# Patient Record
Sex: Male | Born: 1995 | Race: Black or African American | Hispanic: No | Marital: Single | Smoking: Never smoker
Health system: Southern US, Community
[De-identification: ages and names within clinical notes are randomized; demographics above are authoritative.]

---

## 2015-07-30 ENCOUNTER — Emergency Department (HOSPITAL_COMMUNITY): Payer: Medicaid Other

## 2015-07-30 ENCOUNTER — Emergency Department (HOSPITAL_COMMUNITY)
Admission: EM | Admit: 2015-07-30 | Discharge: 2015-07-31 | Disposition: A | Discharge status: Home / Self Care | Payer: Medicaid Other | Attending: Emergency Medicine | Admitting: Emergency Medicine

## 2015-07-30 ENCOUNTER — Encounter (HOSPITAL_COMMUNITY): Payer: Self-pay | Admitting: *Deleted

## 2015-07-30 DIAGNOSIS — R0789 Other chest pain: Secondary | ICD-10-CM | POA: Insufficient documentation

## 2015-07-30 DIAGNOSIS — R079 Chest pain, unspecified: Secondary | ICD-10-CM | POA: Diagnosis present

## 2015-07-30 DIAGNOSIS — R42 Dizziness and giddiness: Secondary | ICD-10-CM | POA: Insufficient documentation

## 2015-07-30 LAB — BASIC METABOLIC PANEL
Anion gap: 7 (ref 5–15)
BUN: 8 mg/dL (ref 6–20)
CHLORIDE: 100 mmol/L — AB (ref 101–111)
CO2: 31 mmol/L (ref 22–32)
CREATININE: 1.04 mg/dL (ref 0.61–1.24)
Calcium: 9.6 mg/dL (ref 8.9–10.3)
GFR calc Af Amer: >60 mL/min (ref >60)
GLUCOSE: 88 mg/dL (ref 65–99)
POTASSIUM: 3.9 mmol/L (ref 3.5–5.1)
SODIUM: 138 mmol/L (ref 135–145)

## 2015-07-30 LAB — CBC
HEMATOCRIT: 45.7 % (ref 39.0–52.0)
Hemoglobin: 16.2 g/dL (ref 13.0–17.0)
MCH: 31.8 pg (ref 26.0–34.0)
MCHC: 35.4 g/dL (ref 30.0–36.0)
MCV: 89.6 fL (ref 78.0–100.0)
PLATELETS: 340 10*3/uL (ref 150–400)
RBC: 5.1 MIL/uL (ref 4.22–5.81)
RDW: 12.7 % (ref 11.5–15.5)
WBC: 6.3 10*3/uL (ref 4.0–10.5)

## 2015-07-30 LAB — I-STAT TROPONIN, ED: Troponin i, poc: 0.01 ng/mL (ref 0.00–0.08)

## 2015-07-30 NOTE — ED Notes (Signed)
EKG given to EDP,J.Knapp,MD., for review. 

## 2015-07-30 NOTE — ED Notes (Signed)
Pt states that he began to have mid chest pain on Sunday; pt states that it has been intermittent since Sunday; pt states that he can be lightheaded at times with the chest pain; pt states that nothing seems to make the pain or worse; pt denies N/V or any other sx at present

## 2015-07-30 NOTE — ED Provider Notes (Signed)
CSN: [id number]     Arrival date & time 07/30/15  2119 History  By signing my name below, I, Budd Palmer, attest that this documentation has been prepared under the direction and in the presence of Devoria Albe, MD at 2333. Electronically Signed: Budd Palmer, ED Scribe. 07/30/2015. 11:43 PM.    Chief Complaint  Patient presents with  . Chest Pain   The history is provided by the patient. No language interpreter was used.   HPI Comments: Jacob Dunn is a 19 y.o. male who presents to the Emergency Department complaining of intermittent, pressure-like, central upper chest pain onset 5 days ago. He states he was sitting in his room at rest when the pain first began. He notes each episode lasts for a few minutes. He has a couple of episodes a week.  He notes his last episode occurred 9 hours ago. He reports he has felt fine since that time. He reports associated lightheadedness. He notes he has had similar pains before, noting that he has had them since this summer. He notes no exacerbating or alleviating factors. He states he has not tried taking any medication for this. He states he is not on any medications. He denies a PMHx of HTN. He notes a FHx of heart problems (father's mother has pacemaker), and MI (paternal grandfather). He notes his grandfather had an MI this summer. He denies smoking, but states he drinks on weekends (3-4 cups of liquor). 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 Pt denies nausea, vomiting, SOB, diaphoresis, and leg swelling or pain. He denies being under extra stress.   PCP none  History reviewed. No pertinent past medical history. History reviewed. No pertinent past surgical history. No family history on file. Social History  Substance Use Topics  . Smoking status: Never Smoker   . Smokeless tobacco: None  . Alcohol Use: Yes     Comment: socially  employed  Review of Systems   Constitutional: Negative for diaphoresis.  Respiratory: Negative for shortness of breath.   Cardiovascular: Positive for chest pain. Negative for leg swelling.  Gastrointestinal: Negative for nausea.  Neurological: Positive for light-headedness.  All other systems reviewed and are negative.   Allergies  Review of patient's allergies indicates no known allergies.  Home Medications   Prior to Admission medications   Not on File   BP 113/70 mmHg  Pulse 61  Temp(Src) 98.3 F (36.8 C) (Oral)  Resp 21  SpO2 100%  Vital signs normal   Physical Exam  Constitutional: He is oriented to person, place, and time. He appears well-developed and well-nourished.  Non-toxic appearance. He does not appear ill. No distress.  HENT:  Head: Normocephalic and atraumatic.  Right Ear: External ear normal.  Left Ear: External ear normal.  Nose: Nose normal. No mucosal edema or rhinorrhea.  Mouth/Throat: Oropharynx is clear and moist and mucous membranes are normal. No dental abscesses or uvula swelling.  Eyes: Conjunctivae and EOM are normal. Pupils are equal, round, and reactive to light.  Neck: Normal range of motion and full passive range of motion without pain. Neck supple.  Cardiovascular: Normal rate, regular rhythm and normal heart sounds.  Exam reveals no gallop and no friction rub.   No murmur heard. Pulmonary/Chest: Effort normal and breath sounds normal. No respiratory distress. He has no wheezes. He has no rhonchi. He has no rales. He exhibits no tenderness and  no crepitus.    Area of chest pain noted.   Abdominal: Soft. Normal appearance and bowel sounds are normal. He exhibits no distension. There is no tenderness. There is no rebound and no guarding.  Musculoskeletal: Normal range of motion. He exhibits no edema or tenderness.  Moves all extremities well.   Neurological: He is alert and oriented to person, place, and time. He has normal strength. No cranial nerve deficit.  Skin:  Skin is warm, dry and intact. No rash noted. No erythema. No pallor.  Psychiatric: He has a normal mood and affect. His speech is normal and behavior is normal. His mood appears not anxious.  Nursing note and vitals reviewed.   ED Course  Procedures  DIAGNOSTIC STUDIES: Oxygen Saturation is 100% on RA, normal by my interpretation.    COORDINATION OF CARE: 11:43 PM - Discussed normal EKG, chest XR, and lab results. Discussed plans to wait on remaining diagnostic studies still pending. Advised to return to the ED if symptoms worsen. Pt advised of plan for treatment and pt agrees.  Labs Review Results for orders placed or performed during the hospital encounter of 07/30/15  Basic metabolic panel  Result Value Ref Range   Sodium 138 135 - 145 mmol/L   Potassium 3.9 3.5 - 5.1 mmol/L   Chloride 100 (L) 101 - 111 mmol/L   CO2 31 22 - 32 mmol/L   Glucose, Bld 88 65 - 99 mg/dL   BUN 8 6 - 20 mg/dL   Creatinine, Ser 1.47 0.61 - 1.24 mg/dL   Calcium 9.6 8.9 - 82.9 mg/dL   GFR calc non Af Amer >60 >60 mL/min   GFR calc Af Amer >60 >60 mL/min   Anion gap 7 5 - 15  CBC  Result Value Ref Range   WBC 6.3 4.0 - 10.5 K/uL   RBC 5.10 4.22 - 5.81 MIL/uL   Hemoglobin 16.2 13.0 - 17.0 g/dL   HCT 56.2 13.0 - 86.5 %   MCV 89.6 78.0 - 100.0 fL   MCH 31.8 26.0 - 34.0 pg   MCHC 35.4 30.0 - 36.0 g/dL   RDW 78.4 69.6 - 29.5 %   Platelets 340 150 - 400 K/uL  I-stat troponin, ED  Result Value Ref Range   Troponin i, poc 0.01 0.00 - 0.08 ng/mL   Comment 3           Laboratory interpretation all normal   Imaging Review Dg Chest 2 View  07/30/2015  CLINICAL DATA:  Intermittent diffuse chest pain for 4 days, lightheadedness. EXAM: CHEST  2 VIEW COMPARISON:  None. FINDINGS: Cardiomediastinal silhouette is normal. The lungs are clear without pleural effusions or focal consolidations. Trachea projects midline and there is no pneumothorax. Soft tissue planes and included osseous structures are  non-suspicious. IMPRESSION: Normal chest. Electronically Signed   By: Awilda Metro M.D.   On: 07/30/2015 22:27   I have personally reviewed and evaluated these images and lab results as part of my medical decision-making.   EKG Interpretation   Date/Time:  Wednesday July 30 2015 21:41:30 EDT Ventricular Rate:  61 PR Interval:  111 QRS Duration: 86 QT Interval:  388 QTC Calculation: 391 R Axis:   92 Text Interpretation:  Sinus rhythm Borderline short PR interval Borderline  right axis deviation Baseline wander in lead(s) I III aVR aVL No old  tracing to compare Confirmed by Kinga Cassar  MD-J, JON ([id number]) on 07/30/2015  9:59:47 PM      MDM  Final diagnoses:  Atypical chest pain   Plan discharge  Devoria AlbeIva Janelie Goltz, MD, FACEP  I personally performed the services described in this documentation, which was scribed in my presence. The recorded information has been reviewed and considered.  Devoria AlbeIva Jamar Weatherall, MD, Concha PyoFACEP   Eddye Broxterman, MD 07/31/15 [phone]

## 2015-07-31 NOTE — ED Notes (Signed)
Patient was alert, oriented and stable upon discharge. RN went over AVS and patient had no further questions.  

## 2015-07-31 NOTE — Discharge Instructions (Signed)
You can take ibuprofen 600 mg 4 times a day for pain if needed. Your tests tonight are normal. Your blood pressure is normal. Drink a lot of fluids especially when it is warm outside. Return to the ED if you get chest pain that is constant and lasts more than 20-30 minutes, you get sweaty, feel short of breath or you feel worse.     Nonspecific Chest Pain It is often hard to find the cause of chest pain. There is always a chance that your pain could be related to something serious, such as a heart attack or a blood clot in your lungs. Chest pain can also be caused by conditions that are not life-threatening. If you have chest pain, it is very important to follow up with your doctor.  HOME CARE  If you were prescribed an antibiotic medicine, finish it all even if you start to feel better.  Avoid any activities that cause chest pain.  Do not use any tobacco products, including cigarettes, chewing tobacco, or electronic cigarettes. If you need help quitting, ask your doctor.  Do not drink alcohol.  Take medicines only as told by your doctor.  Keep all follow-up visits as told by your doctor. This is important. This includes any further testing if your chest pain does not go away.  Your doctor may tell you to keep your head raised (elevated) while you sleep.  Make lifestyle changes as told by your doctor. These may include:  Getting regular exercise. Ask your doctor to suggest some activities that are safe for you.  Eating a heart-healthy diet. Your doctor or a diet specialist (dietitian) can help you to learn healthy eating options.  Maintaining a healthy weight.  Managing diabetes, if necessary.  Reducing stress. GET HELP IF:  Your chest pain does not go away, even after treatment.  You have a rash with blisters on your chest.  You have a fever. GET HELP RIGHT AWAY IF:  Your chest pain is worse.  You have an increasing cough, or you cough up blood.  You have severe belly  (abdominal) pain.  You feel extremely weak.  You pass out (faint).  You have chills.  You have sudden, unexplained chest discomfort.  You have sudden, unexplained discomfort in your arms, back, neck, or jaw.  You have shortness of breath at any time.  You suddenly start to sweat, or your skin gets clammy.  You feel nauseous.  You vomit.  You suddenly feel light-headed or dizzy.  Your heart begins to beat quickly, or it feels like it is skipping beats. These symptoms may be an emergency. Do not wait to see if the symptoms will go away. Get medical help right away. Call your local emergency services (911 in the [place] Do not drive yourself to the hospital.   This information is not intended to replace advice given to you by your health care provider. Make sure you discuss any questions you have with your health care provider.   Document Released: 03/15/2008 Document Revised: 10/18/2014 Document Reviewed: 05/03/2014 Elsevier Interactive Patient Education [employer]2016 Elsevier Inc.

## 2015-09-19 ENCOUNTER — Emergency Department (HOSPITAL_COMMUNITY)
Admission: EM | Admit: 2015-09-19 | Discharge: 2015-09-19 | Disposition: A | Discharge status: Home / Self Care | Payer: Medicaid Other | Attending: Emergency Medicine | Admitting: Emergency Medicine

## 2015-09-19 ENCOUNTER — Encounter (HOSPITAL_COMMUNITY): Payer: Self-pay | Admitting: *Deleted

## 2015-09-19 DIAGNOSIS — R1013 Epigastric pain: Secondary | ICD-10-CM | POA: Diagnosis not present

## 2015-09-19 DIAGNOSIS — R109 Unspecified abdominal pain: Secondary | ICD-10-CM | POA: Diagnosis present

## 2015-09-19 LAB — COMPREHENSIVE METABOLIC PANEL
ALK PHOS: 64 U/L (ref 38–126)
ALT: 22 U/L (ref 17–63)
AST: 25 U/L (ref 15–41)
Albumin: 4.6 g/dL (ref 3.5–5.0)
Anion gap: 8 (ref 5–15)
BILIRUBIN TOTAL: 0.9 mg/dL (ref 0.3–1.2)
BUN: 8 mg/dL (ref 6–20)
CALCIUM: 9.5 mg/dL (ref 8.9–10.3)
CO2: 31 mmol/L (ref 22–32)
CREATININE: 0.99 mg/dL (ref 0.61–1.24)
Chloride: 99 mmol/L — ABNORMAL LOW (ref 101–111)
Glucose, Bld: 108 mg/dL — ABNORMAL HIGH (ref 65–99)
Potassium: 3.7 mmol/L (ref 3.5–5.1)
Sodium: 138 mmol/L (ref 135–145)
TOTAL PROTEIN: 7.5 g/dL (ref 6.5–8.1)

## 2015-09-19 LAB — CBC WITH DIFFERENTIAL/PLATELET
BASOS PCT: 0 %
Basophils Absolute: 0 10*3/uL (ref 0.0–0.1)
EOS ABS: 0.3 10*3/uL (ref 0.0–0.7)
EOS PCT: 5 %
HCT: 43.3 % (ref 39.0–52.0)
Hemoglobin: 15 g/dL (ref 13.0–17.0)
LYMPHS ABS: 2.2 10*3/uL (ref 0.7–4.0)
Lymphocytes Relative: 45 %
MCH: 30.9 pg (ref 26.0–34.0)
MCHC: 34.6 g/dL (ref 30.0–36.0)
MCV: 89.3 fL (ref 78.0–100.0)
Monocytes Absolute: 0.5 10*3/uL (ref 0.1–1.0)
Monocytes Relative: 11 %
Neutro Abs: 2 10*3/uL (ref 1.7–7.7)
Neutrophils Relative %: 39 %
PLATELETS: 256 10*3/uL (ref 150–400)
RBC: 4.85 MIL/uL (ref 4.22–5.81)
RDW: 12.7 % (ref 11.5–15.5)
WBC: 5 10*3/uL (ref 4.0–10.5)

## 2015-09-19 LAB — LIPASE, BLOOD: LIPASE: 30 U/L (ref 11–51)

## 2015-09-19 MED ORDER — PANTOPRAZOLE SODIUM 40 MG PO TBEC
40.0000 mg | DELAYED_RELEASE_TABLET | Freq: Once | ORAL | Status: AC
  Administered 2015-09-19: 40 mg via ORAL
  Filled 2015-09-19: qty 1

## 2015-09-19 MED ORDER — SODIUM CHLORIDE 0.9 % IV SOLN: 1000.0000 mL | INTRAVENOUS | Status: DC

## 2015-09-19 MED ORDER — SODIUM CHLORIDE 0.9 % IV SOLN
1000.0000 mL | Freq: Once | INTRAVENOUS | Status: AC
  Administered 2015-09-19: 1000 mL via INTRAVENOUS

## 2015-09-19 MED ORDER — GI COCKTAIL ~~LOC~~
30.0000 mL | Freq: Once | ORAL | Status: DC
  Filled 2015-09-19: qty 30

## 2015-09-19 MED ORDER — ONDANSETRON HCL 4 MG/2ML IJ SOLN
4.0000 mg | Freq: Once | INTRAMUSCULAR | Status: AC
  Administered 2015-09-19: 4 mg via INTRAVENOUS
  Filled 2015-09-19: qty 2

## 2015-09-19 MED ORDER — DIPHENHYDRAMINE HCL 50 MG/ML IJ SOLN
25.0000 mg | Freq: Once | INTRAMUSCULAR | Status: AC
  Administered 2015-09-19: 25 mg via INTRAVENOUS
  Filled 2015-09-19: qty 1

## 2015-09-19 MED ORDER — PANTOPRAZOLE SODIUM 40 MG PO TBEC: 40.0000 mg | DELAYED_RELEASE_TABLET | Freq: Every day | ORAL | Status: DC

## 2015-09-19 NOTE — ED Provider Notes (Signed)
CSN: [id number]     Arrival date & time 09/19/15  0516 History   First MD Initiated Contact with Patient 09/19/15 0540     Chief Complaint  Patient presents with  . Abdominal Pain     (Consider location/radiation/quality/duration/timing/severity/associated sxs/prior Treatment) Patient is a 19 y.o. male presenting with abdominal pain. The history is provided by the patient.  Abdominal Pain He states that about one hour ago, his abdomen started "feeling funny". Is also noted some shaking in his right leg. He denies any actual pain. There has been nausea but no vomiting. He denies constipation or diarrhea. He denies fever or chills. Nothing seems to make symptoms better nothing makes them worse. He does drink alcohol occasionally but states he had not been drinking last night. He had not eaten anything out of the ordinary.  History reviewed. No pertinent past medical history. History reviewed. No pertinent past surgical history. No family history on file. Social History  Substance Use Topics  . Smoking status: Never Smoker   . Smokeless tobacco: None  . Alcohol Use: Yes     Comment: socially    Review of Systems  Gastrointestinal: Positive for abdominal pain.  All other systems reviewed and are negative.     Allergies  Review of patient's allergies indicates no known allergies.  Home Medications   Prior to Admission medications   Not on File   BP 152/92 mmHg  Pulse 86  Temp(Src) 98.4 F (36.9 C) (Oral)  Resp 18  SpO2 100% Physical Exam  Nursing note and vitals reviewed.  19 year old male, resting comfortably and in no acute distress. Vital signs are significant for hypertension. Oxygen saturation is 100%, which is normal. Head is normocephalic and atraumatic. PERRLA, EOMI. Oropharynx is clear. Neck is nontender and supple without adenopathy or JVD. Back is nontender and there is no CVA tenderness. Lungs are clear without rales, wheezes, or rhonchi. Chest is  nontender. Heart has regular rate and rhythm without murmur. Abdomen is soft, flat, with mild epigastric tenderness. There are no masses or hepatosplenomegaly and peristalsis is normoactive. Extremities have no cyanosis or edema, full range of motion is present. Skin is warm and dry without rash. Neurologic: Mental status is normal, cranial nerves are intact, there are no motor or sensory deficits.  ED Course  Procedures (including critical care time) Labs Review Results for orders placed or performed during the hospital encounter of 09/19/15  Comprehensive metabolic panel  Result Value Ref Range   Sodium 138 135 - 145 mmol/L   Potassium 3.7 3.5 - 5.1 mmol/L   Chloride 99 (L) 101 - 111 mmol/L   CO2 31 22 - 32 mmol/L   Glucose, Bld 108 (H) 65 - 99 mg/dL   BUN 8 6 - 20 mg/dL   Creatinine, Ser 4.090.99 0.61 - 1.24 mg/dL   Calcium 9.5 8.9 - 81.110.3 mg/dL   Total Protein 7.5 6.5 - 8.1 g/dL   Albumin 4.6 3.5 - 5.0 g/dL   AST 25 15 - 41 U/L   ALT 22 17 - 63 U/L   Alkaline Phosphatase 64 38 - 126 U/L   Total Bilirubin 0.9 0.3 - 1.2 mg/dL   GFR calc non Af Amer >60 >60 mL/min   GFR calc Af Amer >60 >60 mL/min   Anion gap 8 5 - 15  Lipase, blood  Result Value Ref Range   Lipase 30 11 - 51 U/L  CBC with Differential  Result Value Ref Range   WBC  5.0 4.0 - 10.5 K/uL   RBC 4.85 4.22 - 5.81 MIL/uL   Hemoglobin 15.0 13.0 - 17.0 g/dL   HCT 45.4 09.8 - 11.9 %   MCV 89.3 78.0 - 100.0 fL   MCH 30.9 26.0 - 34.0 pg   MCHC 34.6 30.0 - 36.0 g/dL   RDW 14.7 82.9 - 56.2 %   Platelets 256 150 - 400 K/uL   Neutrophils Relative % 39 %   Neutro Abs 2.0 1.7 - 7.7 K/uL   Lymphocytes Relative 45 %   Lymphs Abs 2.2 0.7 - 4.0 K/uL   Monocytes Relative 11 %   Monocytes Absolute 0.5 0.1 - 1.0 K/uL   Eosinophils Relative 5 %   Eosinophils Absolute 0.3 0.0 - 0.7 K/uL   Basophils Relative 0 %   Basophils Absolute 0.0 0.0 - 0.1 K/uL   I have personally reviewed and evaluated these lab results as part of my  medical decision-making.   MDM   Final diagnoses:  Epigastric pain    Epigastric tenderness which may represent gastritis. He will be given therapeutic trial of a GI cocktail and screening labs obtained. He will also be given a dose of diphenhydramine to see if it helps with his leg shaking. No indication of serious pathology on exam.  Laboratory workup is unremarkable. He feels much better after above noted treatment. He is discharged with prescription for pantoprazole.  Dione Booze, MD 09/19/15 [phone]

## 2015-09-19 NOTE — Discharge Instructions (Signed)
Abdominal Pain, Adult Many things can cause abdominal pain. Usually, abdominal pain is not caused by a disease and will improve without treatment. It can often be observed and treated at home. Your health care provider will do a physical exam and possibly order blood tests and X-rays to help determine the seriousness of your pain. However, in many cases, more time must pass before a clear cause of the pain can be found. Before that point, your health care provider may not know if you need more testing or further treatment. HOME CARE INSTRUCTIONS Monitor your abdominal pain for any changes. The following actions may help to alleviate any discomfort you are experiencing:  Only take over-the-counter or prescription medicines as directed by your health care provider.  Do not take laxatives unless directed to do so by your health care provider.  Try a clear liquid diet (broth, tea, or water) as directed by your health care provider. Slowly move to a bland diet as tolerated. SEEK MEDICAL CARE IF:  You have unexplained abdominal pain.  You have abdominal pain associated with nausea or diarrhea.  You have pain when you urinate or have a bowel movement.  You experience abdominal pain that wakes you in the night.  You have abdominal pain that is worsened or improved by eating food.  You have abdominal pain that is worsened with eating fatty foods.  You have a fever. SEEK IMMEDIATE MEDICAL CARE IF:  Your pain does not go away within 2 hours.  You keep throwing up (vomiting).  Your pain is felt only in portions of the abdomen, such as the right side or the left lower portion of the abdomen.  You pass bloody or black tarry stools. MAKE SURE YOU:  Understand these instructions.  Will watch your condition.  Will get help right away if you are not doing well or get worse.   This information is not intended to replace advice given to you by your health care provider. Make sure you discuss  any questions you have with your health care provider.   Document Released: 07/07/2005 Document Revised: 06/18/2015 Document Reviewed: 06/06/2013 Elsevier Interactive Patient Education 2016 Elsevier Inc.  Pantoprazole tablets What is this medicine? PANTOPRAZOLE (pan TOE pra zole) prevents the production of acid in the stomach. It is used to treat gastroesophageal reflux disease (GERD), inflammation of the esophagus, and Zollinger-Ellison syndrome. This medicine may be used for other purposes; ask your health care provider or pharmacist if you have questions. What should I tell my health care provider before I take this medicine? They need to know if you have any of these conditions: -liver disease -low levels of magnesium in the blood -an unusual or allergic reaction to omeprazole, lansoprazole, pantoprazole, rabeprazole, other medicines, foods, dyes, or preservatives -pregnant or trying to get pregnant -breast-feeding How should I use this medicine? Take this medicine by mouth. Swallow the tablets whole with a drink of water. Follow the directions on the prescription label. Do not crush, break, or chew. Take your medicine at regular intervals. Do not take your medicine more often than directed. Talk to your pediatrician regarding the use of this medicine in children. While this drug may be prescribed for children as young as 5 years for selected conditions, precautions do apply. Overdosage: If you think you have taken too much of this medicine contact a poison control center or emergency room at once. NOTE: This medicine is only for you. Do not share this medicine with others. What if I  miss a dose? If you miss a dose, take it as soon as you can. If it is almost time for your next dose, take only that dose. Do not take double or extra doses. What may interact with this medicine? Do not take this medicine with any of the following medications: -atazanavir -nelfinavir This medicine may  also interact with the following medications: -ampicillin -delavirdine -erlotinib -iron salts -medicines for fungal infections like ketoconazole, itraconazole and voriconazole -methotrexate -mycophenolate mofetil -warfarin This list may not describe all possible interactions. Give your health care provider a list of all the medicines, herbs, non-prescription drugs, or dietary supplements you use. Also tell them if you smoke, drink alcohol, or use illegal drugs. Some items may interact with your medicine. What should I watch for while using this medicine? It can take several days before your stomach pain gets better. Check with your doctor or health care professional if your condition does not start to get better, or if it gets worse. You may need blood work done while you are taking this medicine. What side effects may I notice from receiving this medicine? Side effects that you should report to your doctor or health care professional as soon as possible: -allergic reactions like skin rash, itching or hives, swelling of the face, lips, or tongue -bone, muscle or joint pain -breathing problems -chest pain or chest tightness -dark yellow or brown urine -dizziness -fast, irregular heartbeat -feeling faint or lightheaded -fever or sore throat -muscle spasm -palpitations -redness, blistering, peeling or loosening of the skin, including inside the mouth -seizures -tremors -unusual bleeding or bruising -unusually weak or tired -yellowing of the eyes or skin Side effects that usually do not require medical attention (Report these to your doctor or health care professional if they continue or are bothersome.): -constipation -diarrhea -dry mouth -headache -nausea This list may not describe all possible side effects. Call your doctor for medical advice about side effects. You may report side effects to FDA at 1-800-FDA-1088. Where should I keep my medicine? Keep out of the reach of  children. Store at room temperature between 15 and 30 degrees C (59 and 86 degrees F). Protect from light and moisture. Throw away any unused medicine after the expiration date. NOTE: This sheet is a summary. It may not cover all possible information. If you have questions about this medicine, talk to your doctor, pharmacist, or health care provider.    2016, Elsevier/[employer]. (2014-11-15 14:45:56)

## 2015-09-19 NOTE — ED Notes (Signed)
Pt states that he began to have abd pain approx an hour ago; pt c/o mid abd pain; pt denies N/V/D; pt states that he is also having "leg shaking"; pt states that his legs shake and then stop and then shake again; no shaking noted in triage

## 2015-11-25 ENCOUNTER — Emergency Department (HOSPITAL_COMMUNITY): Payer: Medicaid Other

## 2015-11-25 ENCOUNTER — Encounter (HOSPITAL_COMMUNITY): Payer: Self-pay | Admitting: Emergency Medicine

## 2015-11-25 ENCOUNTER — Emergency Department (HOSPITAL_COMMUNITY)
Admission: EM | Admit: 2015-11-25 | Discharge: 2015-11-25 | Disposition: A | Discharge status: Home / Self Care | Payer: Medicaid Other | Attending: Emergency Medicine | Admitting: Emergency Medicine

## 2015-11-25 DIAGNOSIS — Y9389 Activity, other specified: Secondary | ICD-10-CM | POA: Diagnosis not present

## 2015-11-25 DIAGNOSIS — S3992XA Unspecified injury of lower back, initial encounter: Secondary | ICD-10-CM | POA: Diagnosis not present

## 2015-11-25 DIAGNOSIS — S20212A Contusion of left front wall of thorax, initial encounter: Secondary | ICD-10-CM | POA: Insufficient documentation

## 2015-11-25 DIAGNOSIS — Y92410 Unspecified street and highway as the place of occurrence of the external cause: Secondary | ICD-10-CM | POA: Insufficient documentation

## 2015-11-25 DIAGNOSIS — S29001A Unspecified injury of muscle and tendon of front wall of thorax, initial encounter: Secondary | ICD-10-CM | POA: Diagnosis present

## 2015-11-25 DIAGNOSIS — Y998 Other external cause status: Secondary | ICD-10-CM | POA: Insufficient documentation

## 2015-11-25 DIAGNOSIS — Z79899 Other long term (current) drug therapy: Secondary | ICD-10-CM | POA: Insufficient documentation

## 2015-11-25 MED ORDER — NAPROXEN 500 MG PO TABS
500.0000 mg | ORAL_TABLET | Freq: Once | ORAL | Status: AC
  Administered 2015-11-25: 500 mg via ORAL
  Filled 2015-11-25: qty 1

## 2015-11-25 MED ORDER — NAPROXEN 500 MG PO TABS: 500.0000 mg | ORAL_TABLET | Freq: Two times a day (BID) | ORAL | Status: DC
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
## 2015-11-28 ENCOUNTER — Emergency Department (HOSPITAL_COMMUNITY)
Admission: EM | Admit: 2015-11-28 | Discharge: 2015-11-28 | Disposition: A | Discharge status: Home / Self Care | Payer: Medicaid Other | Attending: Emergency Medicine | Admitting: Emergency Medicine

## 2015-11-28 ENCOUNTER — Encounter (HOSPITAL_COMMUNITY): Payer: Self-pay | Admitting: Emergency Medicine

## 2015-11-28 DIAGNOSIS — R0781 Pleurodynia: Secondary | ICD-10-CM

## 2015-11-28 DIAGNOSIS — Z791 Long term (current) use of non-steroidal anti-inflammatories (NSAID): Secondary | ICD-10-CM | POA: Diagnosis not present

## 2015-11-28 DIAGNOSIS — Z09 Encounter for follow-up examination after completed treatment for conditions other than malignant neoplasm: Secondary | ICD-10-CM

## 2015-11-28 DIAGNOSIS — Z79899 Other long term (current) drug therapy: Secondary | ICD-10-CM | POA: Diagnosis not present

## 2015-11-28 NOTE — ED Notes (Signed)
Per pt, states he was told to come back in for follow up regarding left rib injury

## 2015-11-28 NOTE — Discharge Instructions (Signed)
You have been seen today for a follow-up on rib pain. Your injuries seem to be healing well. No abnormalities were found on your exam. Follow up with PCP as needed. Return to ED should symptoms worsen. Continue to use ice and ibuprofen or naproxen as prescribed.   Emergency Department Resource Guide 1) Find a Doctor and Pay Out of Pocket Although you won't have to find out who is covered by your insurance plan, it is a good idea to ask around and get recommendations. You will then need to call the office and see if the doctor you have chosen will accept you as a new patient and what types of options they offer for patients who are self-pay. Some doctors offer discounts or will set up payment plans for their patients who do not have insurance, but you will need to ask so you aren't surprised when you get to your appointment.  2) Contact Your Local Health Department Not all health departments have doctors that can see patients for sick visits, but many do, so it is worth a call to see if yours does. If you don't know where your local health department is, you can check in your phone book. The CDC also has a tool to help you locate your state's health department, and many state websites also have listings of all of their local health departments.  3) Find a Walk-in Clinic If your illness is not likely to be very severe or complicated, you may want to try a walk in clinic. These are popping up all over the country in pharmacies, drugstores, and shopping centers. They're usually staffed by nurse practitioners or physician assistants that have been trained to treat common illnesses and complaints. They're usually fairly quick and inexpensive. However, if you have serious medical issues or chronic medical problems, these are probably not your best option.  No Primary Care Doctor: - Call Health Connect at  [phone] - they can help you locate a primary care doctor that  accepts your insurance, provides certain  services, etc. - Physician Referral Service- [phone]  Chronic Pain Problems: Organization         Address  Phone   Notes  [facility]  [phone] Patients need to be referred by their primary care doctor.   Medication Assistance: Organization         Address  Phone   Notes  [facility] Medication [facility] [address] [place] [place], [place] [address] [phone] --Must be a resident of [facility] -- Must have NO insurance coverage whatsoever (no Medicaid/ Medicare, etc.) -- The pt. MUST have a primary care doctor that directs their care regularly and follows them in the community   Med[facility]ist  [phone]   [employer]  [phone]    Agencies that provide inexpensive medical care: Organization         Address  Phone   Notes  [facility]  [phone]   [facility]    [phone]   [facility] [address] [place], [place] [address] [phone]   [facility] [address] [place], [place] [phone]   Planned Parenthood    [phone]   [facility]    [phone]   [facility]  [address],  Phone:  [phone], Fax:  [phone] Hours of Operation:  9 am - 6  pm, M-F.  Also accepts Medicaid/Medicare and self-pay.  [facility] for [place], [address], [place] Phone: [phone], Fax: [phone]. Hours of Operation:  8:30 am - 5:30 pm, M-F.  Also accepts Medicaid and self-pay.  [facility] [place] [address], [place] Phone: [phone]   [place], [place], [place] [phone], Ext. [phone] Mondays & Thursdays: 7-9 AM.  First 15 patients are seen on a first come, first serve basis.    Grants Providers:  Organization         Address  Phone   Notes  [facility] [address], [place] [phone] Also accepts self-pay patients.  [facility] [id number] [place], [place]  [phone]   [place], [address], [place] [phone]   [facility] [address] [place] [phone]   Lucianne Lei [address], [place]   [phone] Only accepts [place] Access [place] patients after they have their name applied to their card.   Self-Pay (no insurance) in [facility]:  Organization         Address  Phone   Notes  Sickle Cell Patients, [facility] [place] [phone]   [facility] [place] [phone]   [facility] [place]  [place], [place], [place] [phone]   [facility]/Dr. Osei-Bonsu  [address], [place] [phone] Phone number for both [place] and [place] locations is the same.  [facility] and [facility] [address], [place] [phone]   [facility] [address], [place] or [address] [phone]   [facility] [address] [place] [phone], phone; [phone], fax Sees patients 1st and 3rd Saturday of every month.  Must not qualify for public or private insurance (i.e. Medicaid, Medicare, [place] Health Choice, Veterans' Benefits)  Household income should be no more than 200% of the poverty level The clinic cannot treat you if you are pregnant or think you are pregnant  Sexually transmitted diseases are not treated at the clinic.    Dental Care: Organization         Address  Phone  Notes  [facility] of [facility] [place] [phone] Accepts  children up to age 46 who are enrolled in [place] or [place]; pregnant women with a Medicaid card; and children who have applied for Medicaid or [place] Health Choice, but were declined, whose parents can pay a reduced fee at time of service.  [facility] of [facility]  [address], [place] [phone] Accepts children up to age 36 who are enrolled in [place] or [place]; pregnant women with a Medicaid card; and children who have applied for Medicaid or [place] Health Choice, but were declined, whose parents can pay a reduced fee at time of service.  [facility] Access PROGRAM  [place] [phone] Patients are seen by appointment only. Walk-ins are not accepted. [place] will see patients 40 years of age and older.  Monday - Tuesday (8am-5pm) Most Wednesdays (8:30-5pm) $30 per visit, cash only  [facility] Adult Dental Access PROGRAM  [address], [facility] [phone] Patients are seen by appointment only. Walk-ins are not accepted. [place] will see patients 62 years of age and older. One Wednesday Evening (Monthly: Volunteer Based).  $30 per visit, cash only  [place]  [phone] for adults; Children under age 103, call Graduate Pediatric Dentistry at [phone]. Children aged 73-14, please call [phone] to request a pediatric application.  Dental services are provided in all areas of dental care including fillings, crowns and bridges, complete and partial dentures, implants, gum treatment, root canals, and extractions. Preventive care is also provided. Treatment is provided to both adults and children. Patients are selected via a lottery and there is often a waiting list.   [facility] [address] [place]  [phone] [email or web address]   [facility] [address] [place], [place] [phone], Ext. [phone] Second and  Fourth Thursday of each month, opens at 6:30 AM; Clinic ends at 9 AM.  Patients are seen on a first-come first-served basis, and a limited number are seen during each clinic.   [facility]  [address] Hillard Danker [place], [place] [phone]   Eligibility Requirements You must have lived in [place], [place], or [place] counties for at least the last three months.   You cannot be eligible for state or federal sponsored [employer], including [employer], [place], or [employer].   You generally cannot be eligible for healthcare insurance through your employer.    How to apply: Eligibility screenings are held every Tuesday and Wednesday afternoon from 1:00 pm until 4:00 pm. You do not need an appointment for the interview!  [facility] [address], [place], [place]   [place]  [place] Department  [place]  [phone]    Behavioral Health Resources in the Community: Intensive Outpatient Programs Organization         Address  Phone  Notes  [facility] [address] [place], [place] [phone]   [facility] Outpatient [address] [place], [place]   ADS: Alcohol & Drug Svcs [address] [place], [place]   [place] [address],  [place], [place] or [phone]   Substance Abuse Resources Organization         Address  Phone  Notes  Alcohol and Drug Services  [phone]   [place]  [phone]   [place]   Chinita Pester  [phone]   Residential & Outpatient Substance Abuse Program  [phone]   Psychological Services Organization         Address  Phone  Notes  [facility] [place]  [place]  [phone]   [place]  [address], [place] or [phone]    Mobile Crisis Teams Organization         Address  Phone  Notes  [facility], Mobile Crisis Care Unit  [phone]   [facility]  [address] [place], [place]   [place] Levels [address] [place] [phone]    Self-Help/Support Groups Organization         Address  Phone  Notes  Mental Health Assoc. of [place] - variety of support groups  [place] Call for more information  Narcotics Anonymous (NA), [facility] [address], [employer] [place]  2 meetings at this location   Special educational needs teacher         Address  Phone  Notes  ASAP Residential Treatment [place],    [place]  [phone]   [facility]  [address] [place] 762831, [place], [place]   [place], [place] [phone] Admissions: 8am-3pm M-F  Incentives Substance [place] [address]    [place], [place] [phone]   [facility] [address] [place], [place], [place]   [facility] [address],  [place], [place]   [facility] - Intensive Outpatient [address]een [address], [place], [place]   [facility] [place].,  [place], [place] [phone] or [phone]   [facility] ([facility]) [address] [place], [place] Accepts Medicaid  [facility] [employer] [address],  [place] [phone] Substance Abuse/Addiction Treatment   [facility] Organization         Address  Phone  Notes  [facility]  [phone]   Domenic Schwab, PhD [address]lis Porta [place], [place]   [phone] or [phone]   [place], [place] [phone]   [facility] [address] [place], [place] [phone] Insurance/Medicaid/sponsorship through [facility] and Families [address], [address] [place]                                    [place], [place] [phone] [place] [address], [place] [phone]    Dr. Adele Schilder  [phone]   [facility] Dept. 1) [address] [place] 2) [place] 3)  [place] 65, [place] [phone]  [phone]   [place] [phone] or [phone] (After Hours)
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
## 2016-06-30 ENCOUNTER — Emergency Department (HOSPITAL_COMMUNITY): Payer: Self-pay

## 2016-06-30 ENCOUNTER — Emergency Department (HOSPITAL_COMMUNITY)
Admission: EM | Admit: 2016-06-30 | Discharge: 2016-06-30 | Disposition: A | Discharge status: Home / Self Care | Payer: Self-pay | Attending: Emergency Medicine | Admitting: Emergency Medicine

## 2016-06-30 ENCOUNTER — Encounter (HOSPITAL_COMMUNITY): Payer: Self-pay | Admitting: Emergency Medicine

## 2016-06-30 DIAGNOSIS — B349 Viral infection, unspecified: Secondary | ICD-10-CM | POA: Insufficient documentation

## 2016-06-30 DIAGNOSIS — Z79899 Other long term (current) drug therapy: Secondary | ICD-10-CM | POA: Insufficient documentation

## 2016-06-30 DIAGNOSIS — Z791 Long term (current) use of non-steroidal anti-inflammatories (NSAID): Secondary | ICD-10-CM | POA: Insufficient documentation

## 2016-06-30 LAB — BASIC METABOLIC PANEL
Anion gap: 10 (ref 5–15)
BUN: 9 mg/dL (ref 6–20)
CHLORIDE: 98 mmol/L — AB (ref 101–111)
CO2: 28 mmol/L (ref 22–32)
CREATININE: 1.13 mg/dL (ref 0.61–1.24)
Calcium: 9.7 mg/dL (ref 8.9–10.3)
GFR calc Af Amer: >60 mL/min (ref >60)
GFR calc non Af Amer: >60 mL/min (ref >60)
GLUCOSE: 103 mg/dL — AB (ref 65–99)
POTASSIUM: 3.5 mmol/L (ref 3.5–5.1)
SODIUM: 136 mmol/L (ref 135–145)

## 2016-06-30 LAB — CBC WITH DIFFERENTIAL/PLATELET
Basophils Absolute: 0 10*3/uL (ref 0.0–0.1)
Basophils Relative: 0 %
EOS ABS: 0.1 10*3/uL (ref 0.0–0.7)
EOS PCT: 1 %
HCT: 47.5 % (ref 39.0–52.0)
HEMOGLOBIN: 16.5 g/dL (ref 13.0–17.0)
LYMPHS ABS: 0.9 10*3/uL (ref 0.7–4.0)
LYMPHS PCT: 8 %
MCH: 31.3 pg (ref 26.0–34.0)
MCHC: 34.7 g/dL (ref 30.0–36.0)
MCV: 90.1 fL (ref 78.0–100.0)
MONOS PCT: 11 %
Monocytes Absolute: 1.2 10*3/uL — ABNORMAL HIGH (ref 0.1–1.0)
Neutro Abs: 8.9 10*3/uL — ABNORMAL HIGH (ref 1.7–7.7)
Neutrophils Relative %: 80 %
PLATELETS: 280 10*3/uL (ref 150–400)
RBC: 5.27 MIL/uL (ref 4.22–5.81)
RDW: 12.9 % (ref 11.5–15.5)
WBC: 11.1 10*3/uL — AB (ref 4.0–10.5)

## 2016-06-30 LAB — MONONUCLEOSIS SCREEN: Mono Screen: NEGATIVE

## 2016-06-30 MED ORDER — MAGIC MOUTHWASH W/LIDOCAINE: 5.0000 mL | Freq: Four times a day (QID) | ORAL | 0 refills | Status: DC | PRN

## 2016-06-30 MED ORDER — FLUTICASONE PROPIONATE 50 MCG/ACT NA SUSP: 2.0000 | Freq: Every day | NASAL | 0 refills | Status: DC

## 2016-06-30 MED ORDER — BENZONATATE 100 MG PO CAPS: 100.0000 mg | ORAL_CAPSULE | Freq: Three times a day (TID) | ORAL | 0 refills | Status: DC | PRN

## 2016-06-30 MED ORDER — ACETAMINOPHEN 325 MG PO TABS
650.0000 mg | ORAL_TABLET | Freq: Once | ORAL | Status: AC
  Administered 2016-06-30: 650 mg via ORAL
  Filled 2016-06-30: qty 2

## 2016-06-30 MED ORDER — IBUPROFEN 600 MG PO TABS: 600.0000 mg | ORAL_TABLET | Freq: Four times a day (QID) | ORAL | 0 refills | Status: AC | PRN

## 2016-06-30 MED ORDER — NAPROXEN 500 MG PO TABS
500.0000 mg | ORAL_TABLET | Freq: Once | ORAL | Status: AC
  Administered 2016-06-30: 500 mg via ORAL
  Filled 2016-06-30: qty 1

## 2016-06-30 NOTE — ED Triage Notes (Signed)
Pt from home with complaints of generalized aches, headache, and fever that began this morning. Pt states he has felt hot all day, but has not measured a temperature. Pt states he took an orange pill today, but was not sure what it was- maybe tylenol cold.  Pt has clear lung sounds, denies emesis, denies diarrhea, and denies urinary symptoms.

## 2016-06-30 NOTE — ED Notes (Signed)
Pt ambulatory and independent at discharge.  Verbalized understanding of discharge instructions 

## 2016-06-30 NOTE — ED Provider Notes (Signed)
[facility]-EMERGENCY DEPT Provider Note   CSN: [id number] Arrival date & time: 06/30/16  0005  By signing my name below, I, Majel Homer, attest that this documentation has been prepared under the direction and in the presence of non-physician practitioner, Antony Madura, PA-C. Electronically Signed: Majel Homer, Scribe. 06/30/2016. 1:35 AM.   History   Chief Complaint Chief Complaint  Patient presents with  . Fever  . Generalized Body Aches   The history is provided by the patient. No language interpreter was used.   HPI Comments: Jacob Dunn is a 20 y.o. male who presents to the Emergency Department complaining of gradually worsening, generalized body aches, weakness and headache that began this morning when he awoke from sleep. Pt reports associated sore throat, productive cough, rhinorrhea and subjective fever; he notes his sore throat is exacerbated when eating. Pt states he has taken sudafed with no relief. He denies sick contacts, ear pain or pressure, neck stiffness, shortness of breath, abdominal pain, vomiting and diarrhea.   History reviewed. No pertinent past medical history.  There are no active problems to display for this patient.  History reviewed. No pertinent surgical history.   Home Medications    Prior to Admission medications   Medication Sig Start Date End Date Taking? Authorizing Provider  benzonatate (TESSALON) 100 MG capsule Take 1 capsule (100 mg total) by mouth 3 (three) times daily as needed for cough. 06/30/16   Antony Madura, PA-C  fluticasone (FLONASE) 50 MCG/ACT nasal spray Place 2 sprays into both nostrils daily. 06/30/16   Antony Madura, PA-C  ibuprofen (ADVIL,MOTRIN) 600 MG tablet Take 1 tablet (600 mg total) by mouth every 6 (six) hours as needed for fever, headache, mild pain or moderate pain. 06/30/16   Antony Madura, PA-C  magic mouthwash w/lidocaine SOLN Take 5 mLs by mouth 4 (four) times daily as needed for mouth pain. 06/30/16   Antony Madura, PA-C    naproxen (NAPROSYN) 500 MG tablet Take 1 tablet (500 mg total) by mouth 2 (two) times daily. 11/25/15   Hanna Patel-Mills, PA-C  pantoprazole (PROTONIX) 40 MG tablet Take 1 tablet (40 mg total) by mouth daily. 09/19/15   Dione Booze, MD    Family History No family history on file.  Social History Social History  Substance Use Topics  . Smoking status: Never Smoker  . Smokeless tobacco: Never Used  . Alcohol use Yes     Comment: socially     Allergies   Review of patient's allergies indicates no known allergies.   Review of Systems Review of Systems  Constitutional: Positive for fever (subjective).  HENT: Positive for rhinorrhea and sore throat. Negative for ear pain.   Respiratory: Positive for cough. Negative for shortness of breath.   Gastrointestinal: Negative for abdominal pain, diarrhea and vomiting.  Musculoskeletal: Positive for myalgias. Negative for neck stiffness.  Neurological: Positive for weakness and headaches.  Ten systems reviewed and are negative for acute change, except as noted in the HPI.    Physical Exam Updated Vital Signs BP 127/92 (BP Location: Left Arm)   Pulse 99   Temp 99.1 F (37.3 C) (Oral)   Ht 5\' 8"  (1.727 m)   Wt 170 lb (77.1 kg)   SpO2 97%   BMI 25.85 kg/m   Physical Exam  Constitutional: He is oriented to person, place, and time. He appears well-developed and well-nourished. No distress.  Nontoxic/nonseptic appearing  HENT:  Head: Normocephalic and atraumatic.  Mild posterior oropharyngeal erythema. No edema or exudates. Uvula midline.  Patient tolerating secretions without difficulty.  Eyes: Conjunctivae and EOM are normal. Pupils are equal, round, and reactive to light. No scleral icterus.  Neck: Normal range of motion.  No nuchal rigidity or meningismus  Cardiovascular: Normal rate, regular rhythm and intact distal pulses.   Pulmonary/Chest: Effort normal. No respiratory distress. He has no wheezes. He has no rales.  Lungs  CTAB  Abdominal: Soft. He exhibits no distension. There is no tenderness. There is no guarding.  Soft, nontender, nondistended abdomen. No masses or peritoneal signs.  Musculoskeletal: Normal range of motion.  Neurological: He is alert and oriented to person, place, and time.  GCS 15. Patient moving all extremities.  Skin: Skin is warm and dry. No rash noted. He is not diaphoretic. No erythema. No pallor.  Psychiatric: He has a normal mood and affect. His behavior is normal.  Nursing note and vitals reviewed.    ED Treatments / Results  Labs (all labs ordered are listed, but only abnormal results are displayed) Labs Reviewed  CBC WITH DIFFERENTIAL/PLATELET - Abnormal; Notable for the following:       Result Value   WBC 11.1 (*)    Neutro Abs 8.9 (*)    Monocytes Absolute 1.2 (*)    All other components within normal limits  BASIC METABOLIC PANEL - Abnormal; Notable for the following:    Chloride 98 (*)    Glucose, Bld 103 (*)    All other components within normal limits  MONONUCLEOSIS SCREEN    EKG  EKG Interpretation None       Radiology Dg Chest 2 View  Result Date: 06/30/2016 CLINICAL DATA:  Acute onset of cough, fever, chest congestion, sore throat and body aches. EXAM: CHEST  2 VIEW COMPARISON:  Chest radiograph performed 11/25/2015 FINDINGS: The lungs are well-aerated and clear. There is no evidence of focal opacification, pleural effusion or pneumothorax. The heart is normal in size; the mediastinal contour is within normal limits. No acute osseous abnormalities are seen. IMPRESSION: No acute cardiopulmonary process seen. Electronically Signed   By: Roanna RaiderJeffery  Chang M.D.   On: 06/30/2016 02:03    Procedures Procedures (including critical care time)  Medications Ordered in ED Medications  naproxen (NAPROSYN) tablet 500 mg (500 mg Oral Given 06/30/16 0220)  acetaminophen (TYLENOL) tablet 650 mg (650 mg Oral Given 06/30/16 0221)     Initial Impression / Assessment  and Plan / ED Course  I have reviewed the triage vital signs and the nursing notes.  Pertinent labs & imaging results that were available during my care of the patient were reviewed by me and considered in my medical decision making (see chart for details).  Clinical Course  DIAGNOSTIC STUDIES:  Oxygen Saturation is 97% on RA, normal by my interpretation.    COORDINATION OF CARE:  1:34 AM  Discussed treatment plan with pt at bedside and pt agreed to plan.  3:06 AM Patient with low-grade temperature as well as mild leukocytosis. Suspect that symptoms are secondary to a viral illness. Patient has reassuring electrolytes and a negative Monospot. No evidence of pneumonia on chest x-ray. No nuchal rigidity or meningismus. Doubt meningitis. Plan to treat supportively with NSAIDs. No dictation for further emergent workup at this time. Patient discharged in satisfactory condition with no unaddressed concerns.  I personally performed the services described in this documentation, which was scribed in my presence. The recorded information has been reviewed and is accurate.   Final Clinical Impressions(s) / ED Diagnoses   Final diagnoses:  Viral illness    New Prescriptions New Prescriptions   BENZONATATE (TESSALON) 100 MG CAPSULE    Take 1 capsule (100 mg total) by mouth 3 (three) times daily as needed for cough.   FLUTICASONE (FLONASE) 50 MCG/ACT NASAL SPRAY    Place 2 sprays into both nostrils daily.   IBUPROFEN (ADVIL,MOTRIN) 600 MG TABLET    Take 1 tablet (600 mg total) by mouth every 6 (six) hours as needed for fever, headache, mild pain or moderate pain.   MAGIC MOUTHWASH W/LIDOCAINE SOLN    Take 5 mLs by mouth 4 (four) times daily as needed for mouth pain.     Antony Madura, PA-C 06/30/16 4098    April Palumbo, MD 06/30/16 [phone]

## 2016-09-09 ENCOUNTER — Encounter (HOSPITAL_COMMUNITY): Payer: Self-pay | Admitting: Emergency Medicine

## 2016-09-09 ENCOUNTER — Emergency Department (HOSPITAL_COMMUNITY): Payer: Self-pay

## 2016-09-09 ENCOUNTER — Emergency Department (HOSPITAL_COMMUNITY)
Admission: EM | Admit: 2016-09-09 | Discharge: 2016-09-09 | Disposition: A | Discharge status: Home / Self Care | Payer: Self-pay | Attending: Emergency Medicine | Admitting: Emergency Medicine

## 2016-09-09 DIAGNOSIS — B9789 Other viral agents as the cause of diseases classified elsewhere: Secondary | ICD-10-CM

## 2016-09-09 DIAGNOSIS — J069 Acute upper respiratory infection, unspecified: Secondary | ICD-10-CM | POA: Insufficient documentation

## 2016-09-09 DIAGNOSIS — Z79899 Other long term (current) drug therapy: Secondary | ICD-10-CM | POA: Insufficient documentation

## 2016-09-09 MED ORDER — BENZONATATE 100 MG PO CAPS: 100.0000 mg | ORAL_CAPSULE | Freq: Three times a day (TID) | ORAL | 0 refills | Status: DC

## 2016-09-09 NOTE — ED Triage Notes (Signed)
Pt c/o sore throat onset 09/02/16, cold sweats, headache, clear rhinorrhea with clear mucus, productive cough x 3 days. No emesis, diarrhea, abdominal pain.

## 2016-09-09 NOTE — ED Provider Notes (Signed)
[facility]-EMERGENC[facility] DEPT Provider Note   CSN: [id number] Arrival date & time: 09/09/16  1305  By signing my name below, I, Jacob Dunn, attest that this documentation has been prepared under the direction and in the presence of Jacob Mornavid Mileydi Milsap, FNP. Electronically Signed: Bridgette HabermannMaria Dunn, ED Scribe. 09/09/16. 2:06 PM.  History   Chief Complaint Chief Complaint  Patient presents with  . Cough  . Sore Throat   HPI Comments: Jacob Dunn is a 20 y.o. male ,with no pertinent PMHx, who presents to the Emergency Department complaining of sore throat onset one week ago with associated productive cough, chills, headache, and rhinorrhea. Pt notes that pain is most prominent at night and exacerbated with eating. Pt has not tried any OTC medications PTA. No known sick contacts with similar symptoms. He denies fever, chills, vomiting, diarrhea, abdominal pain, or any other associated symptoms.   The history is provided by the patient. No language interpreter was used.  Sore Throat  This is a new problem. The current episode started more than 1 week ago. The problem occurs constantly. The problem has not changed since onset.Associated symptoms include headaches. The symptoms are aggravated by eating and drinking. Nothing relieves the symptoms. He has tried nothing for the symptoms.  Cough  This is a new problem. The current episode started more than 1 week ago. The problem occurs constantly. The problem has not changed since onset.The cough is productive of sputum. There has been no fever. Associated symptoms include chills, headaches, rhinorrhea and sore throat. He has tried nothing for the symptoms.    History reviewed. No pertinent past medical history.  There are no active problems to display for this patient.  History reviewed. No pertinent surgical history.    Home Medications    Prior to Admission medications   Medication Sig Start Date End Date Taking? Authorizing Provider  benzonatate (TESSALON)  100 MG capsule Take 1 capsule (100 mg total) by mouth 3 (three) times daily as needed for cough. 06/30/16   Antony MaduraKelly Humes, PA-C  fluticasone (FLONASE) 50 MCG/ACT nasal spray Place 2 sprays into both nostrils daily. 06/30/16   Antony MaduraKelly Humes, PA-C  ibuprofen (ADVIL,MOTRIN) 600 MG tablet Take 1 tablet (600 mg total) by mouth every 6 (six) hours as needed for fever, headache, mild pain or moderate pain. 06/30/16   Antony MaduraKelly Humes, PA-C  magic mouthwash w/lidocaine SOLN Take 5 mLs by mouth 4 (four) times daily as needed for mouth pain. 06/30/16   Antony MaduraKelly Humes, PA-C  naproxen (NAPROSYN) 500 MG tablet Take 1 tablet (500 mg total) by mouth 2 (two) times daily. 11/25/15   Hanna Patel-Mills, PA-C  pantoprazole (PROTONIX) 40 MG tablet Take 1 tablet (40 mg total) by mouth daily. 09/19/15   Dione Boozeavid Glick, MD    Family History History reviewed. No pertinent family history.  Social History Social History  Substance Use Topics  . Smoking status: Never Smoker  . Smokeless tobacco: Never Used  . Alcohol use Yes     Comment: socially     Allergies   Patient has no known allergies.   Review of Systems Review of Systems  Constitutional: Positive for chills. Negative for fever.  HENT: Positive for rhinorrhea and sore throat.   Respiratory: Positive for cough.   Neurological: Positive for headaches.  All other systems reviewed and are negative.  Physical Exam Updated Vital Signs BP 139/79 (BP Location: Left Arm)   Pulse 84   Temp 97.7 F (36.5 C) (Oral)   Resp 16  Ht 5\' 9"  (1.753 m)   Wt 176 lb (79.8 kg)   SpO2 100%   BMI 25.99 kg/m   Physical Exam  Constitutional: He appears well-developed and well-nourished.  HENT:  Head: Normocephalic and atraumatic.  Mouth/Throat: Posterior oropharyngeal erythema present. No oropharyngeal exudate.  Eyes: Conjunctivae are normal.  Cardiovascular: Normal rate, regular rhythm and normal heart sounds.  Exam reveals no gallop and no friction rub.   No murmur  heard. Pulmonary/Chest: Effort normal and breath sounds normal. No respiratory distress. He has no wheezes. He has no rales.  Abdominal: He exhibits no distension.  Musculoskeletal: Normal range of motion.  Neurological: He is alert.  Skin: Skin is warm and dry.  Psychiatric: He has a normal mood and affect. His behavior is normal.  Nursing note and vitals reviewed.    ED Treatments / Results  DIAGNOSTIC STUDIES: Oxygen Saturation is 100% on RA, normal by my interpretation.    COORDINATION OF CARE: 2:00 PM Discussed treatment plan with pt at bedside which includes CXR and pt agreed to plan.  Labs (all labs ordered are listed, but only abnormal results are displayed) Labs Reviewed - No data to display  EKG  EKG Interpretation None       Radiology No results found.  Procedures Procedures (including critical care time)  Medications Ordered in ED Medications - No data to display   Initial Impression / Assessment and Plan / ED Course  I have reviewed the triage vital signs and the nursing notes.  Pertinent labs & imaging results that were available during my care of the patient were reviewed by me and considered in my medical decision making (see chart for details).  Clinical Course       Final Clinical Impressions(s) / ED Diagnoses   Final diagnoses:  Viral URI with cough   Pt symptoms consistent with URI.  Pt will be discharged with symptomatic treatment. Discussed return precautions.  Pt is hemodynamically stable & in NAD prior to discharge.  New Prescriptions Discharge Medication List as of 09/09/2016  2:58 PM     I personally performed the services described in this documentation, which was scribed in my presence. The recorded information has been reviewed and is accurate.   Jacob Mornavid Bexley Laubach, NP 09/09/16 1615    Laurence Spatesachel Morgan Little, MD 09/10/16 [phone]

## 2016-09-19 IMAGING — CR DG CHEST 2V
2 series · 2 of 2 positions shown · non-contrast
Comparison: None.

CLINICAL DATA: Intermittent diffuse chest pain for 4 days,
lightheadedness.

EXAM:
CHEST  2 VIEW

[w chest pa]
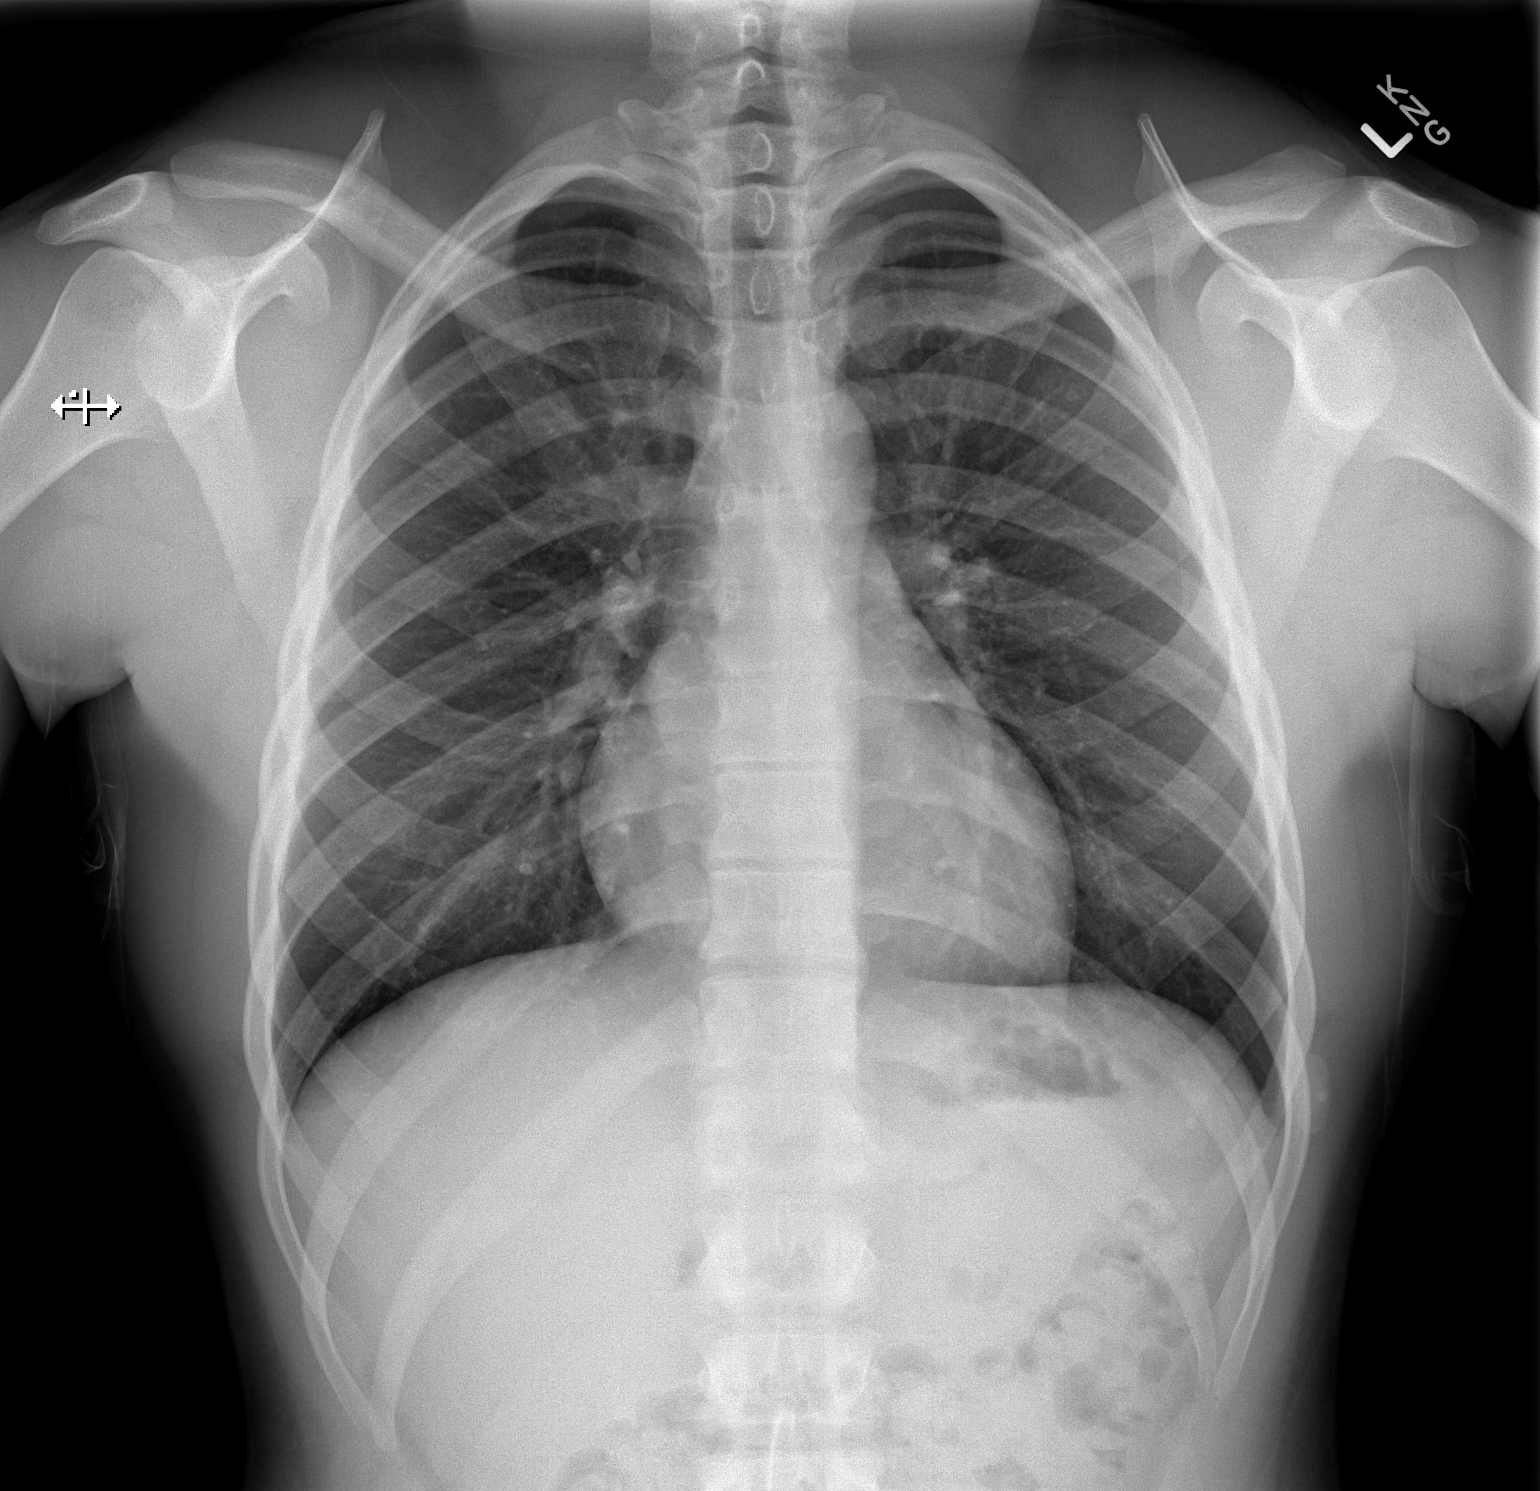

[w chest lat]
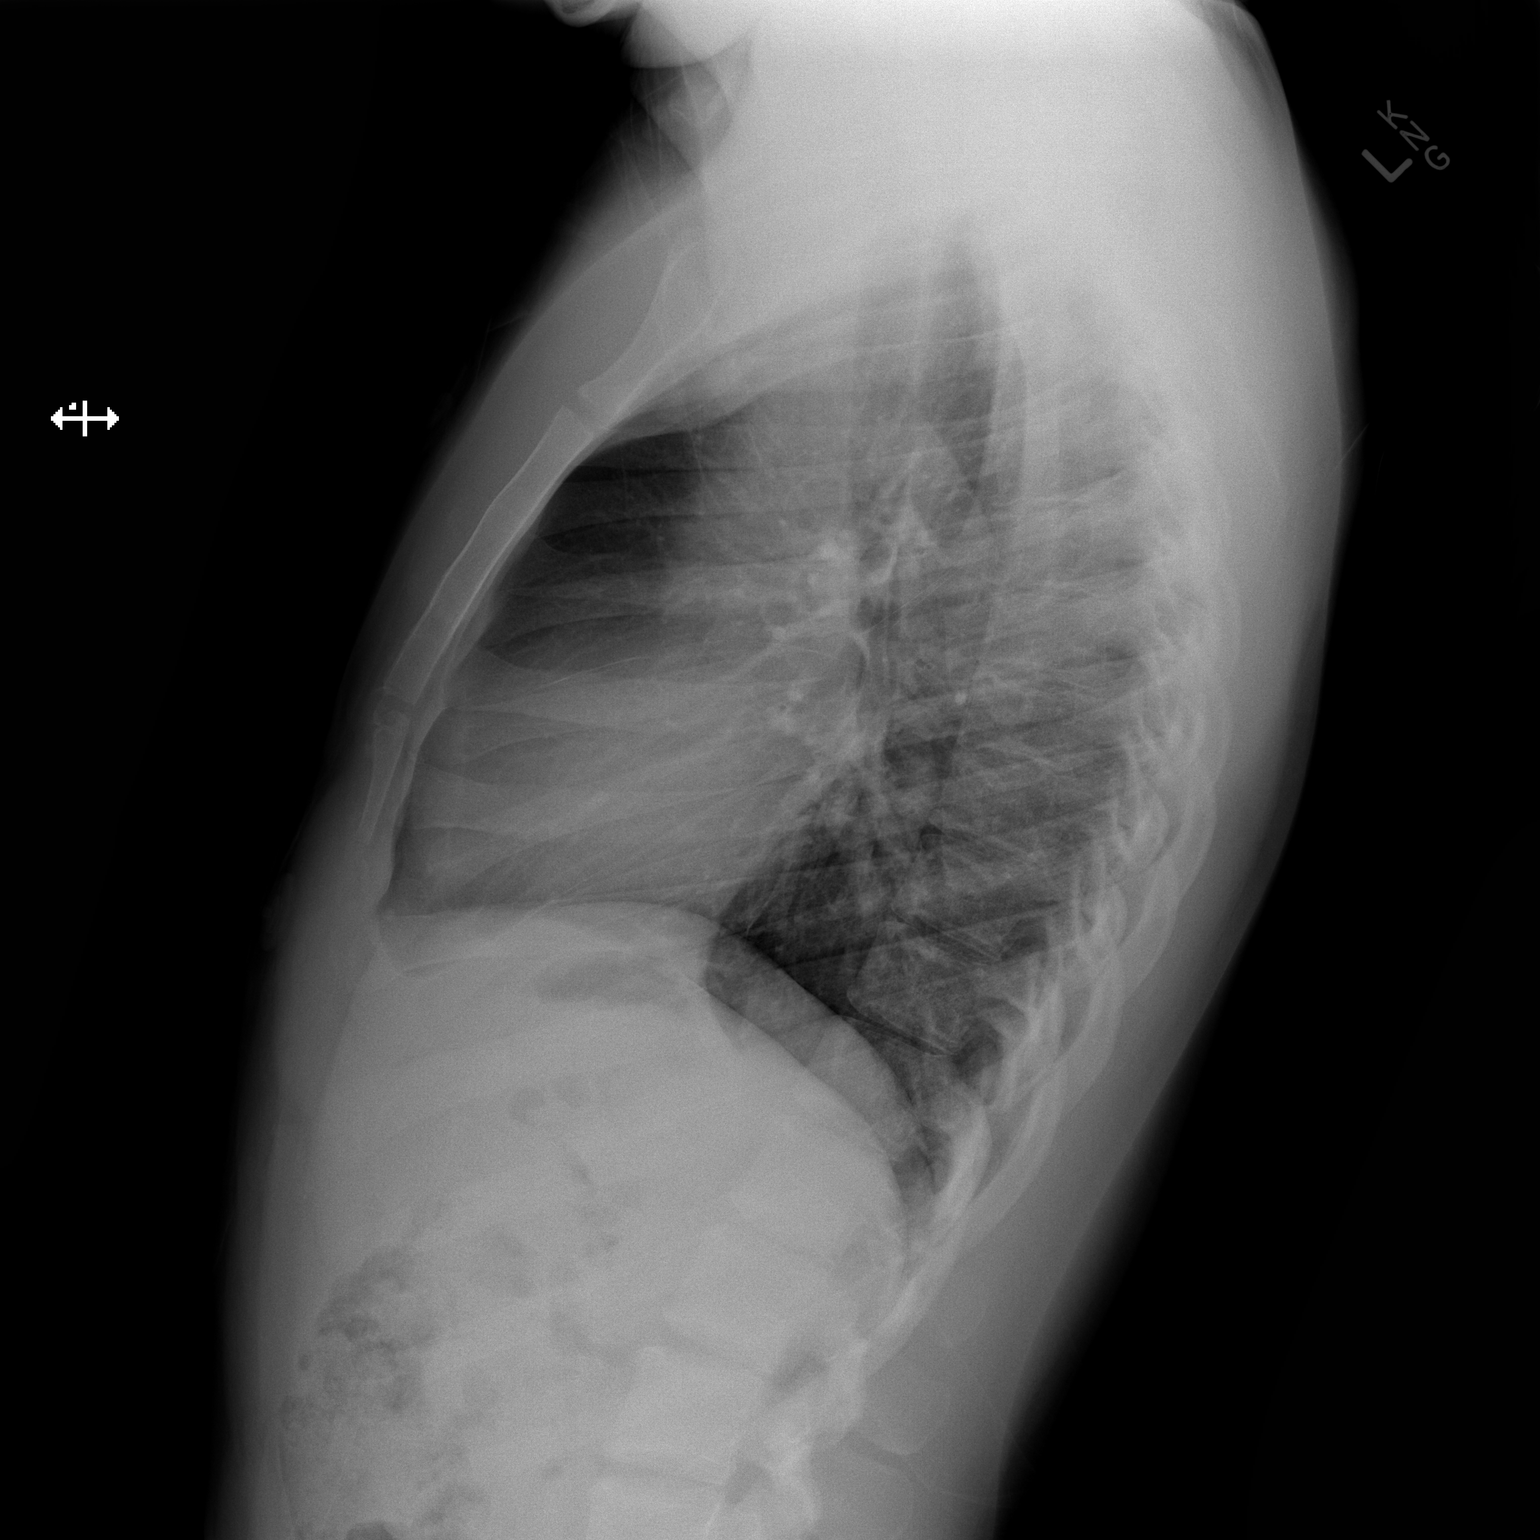

[2 of 2 positions shown; findings below may reference images not displayed]

FINDINGS: Cardiomediastinal silhouette is normal. The lungs are clear without
pleural effusions or focal consolidations. Trachea projects midline
and there is no pneumothorax. Soft tissue planes and included
osseous structures are non-suspicious.
IMPRESSION: Normal chest.

## 2017-11-22 ENCOUNTER — Encounter (HOSPITAL_COMMUNITY): Payer: Self-pay | Admitting: Family Medicine

## 2017-11-22 ENCOUNTER — Ambulatory Visit (HOSPITAL_COMMUNITY)
Admission: EM | Admit: 2017-11-22 | Discharge: 2017-11-22 | Disposition: A | Discharge status: Home / Self Care | Payer: BLUE CROSS/BLUE SHIELD | Attending: Family Medicine | Admitting: Family Medicine

## 2017-11-22 DIAGNOSIS — R369 Urethral discharge, unspecified: Secondary | ICD-10-CM | POA: Diagnosis not present

## 2017-11-22 DIAGNOSIS — R300 Dysuria: Secondary | ICD-10-CM | POA: Insufficient documentation

## 2017-11-22 DIAGNOSIS — Z113 Encounter for screening for infections with a predominantly sexual mode of transmission: Secondary | ICD-10-CM

## 2017-11-22 LAB — POCT URINALYSIS DIP (DEVICE)
Bilirubin Urine: NEGATIVE
GLUCOSE, UA: NEGATIVE mg/dL
Ketones, ur: NEGATIVE mg/dL
NITRITE: NEGATIVE
Protein, ur: NEGATIVE mg/dL
SPECIFIC GRAVITY, URINE: 1.025 (ref 1.005–1.030)
UROBILINOGEN UA: 1 mg/dL (ref 0.0–1.0)
pH: 7 (ref 5.0–8.0)

## 2017-11-22 MED ORDER — AZITHROMYCIN 250 MG PO TABS
1000.0000 mg | ORAL_TABLET | Freq: Once | ORAL | Status: AC
  Administered 2017-11-22: 1000 mg via ORAL

## 2017-11-22 MED ORDER — CEFTRIAXONE SODIUM 250 MG IJ SOLR
INTRAMUSCULAR | Status: AC
  Filled 2017-11-22: qty 250

## 2017-11-22 MED ORDER — CEFTRIAXONE SODIUM 250 MG IJ SOLR
250.0000 mg | Freq: Once | INTRAMUSCULAR | Status: AC
  Administered 2017-11-22: 250 mg via INTRAMUSCULAR

## 2017-11-22 MED ORDER — AZITHROMYCIN 250 MG PO TABS
ORAL_TABLET | ORAL | Status: AC
  Filled 2017-11-22: qty 4

## 2017-11-22 NOTE — ED Triage Notes (Signed)
Pt here for irritation to penis and burning with urination x 1 week. Slight drainage.

## 2017-11-22 NOTE — Discharge Instructions (Signed)

## 2017-11-22 NOTE — ED Provider Notes (Signed)
  [facility]   [id number] 11/22/17 Arrival Time: 1753  ASSESSMENT & PLAN:  1. Penile discharge     Meds ordered this encounter  Medications  . azithromycin (ZITHROMAX) tablet 1,000 mg  . cefTRIAXone (ROCEPHIN) injection 250 mg   Pending: Labs Reviewed  URINE CYTOLOGY ANCILLARY ONLY   Will notify of any positive results. Instructed to refrain from sexual activity for at least seven days.  Reviewed expectations re: course of current medical issues. Questions answered. Outlined signs and symptoms indicating need for more acute intervention. Patient verbalized understanding. After Visit Summary given.   SUBJECTIVE:  Jacob Dunn is a 22 y.o. male who presents with complaint of penile discharge. Onset gradual, several days ago. Describes discharge as thick and white/yellow. Urinary symptoms: mild dysuria. Afebrile. No abdominal or pelvic pain. No n/v. No rashes or lesions. Sexually active with single male partner. OTC treatment: None. History of similar symptoms: none reported.  ROS: As per HPI.  OBJECTIVE:  Vitals:   11/22/17 1844  BP: 133/86  Pulse: 75  Resp: 18  Temp: 98.1 F (36.7 C)  SpO2: 99%    General appearance: alert, cooperative, appears stated age and no distress Throat: lips, mucosa, and tongue normal; teeth and gums normal Back: no CVA tenderness Abdomen: soft, non-tender; bowel sounds normal; no masses or organomegaly; no guarding or rebound tenderness GU: declines Skin: warm and dry Psychological:  Alert and cooperative. Normal mood and affect.   No Known Allergies   Social History   Socioeconomic History  . Marital status: Single    Spouse name: Not on file  . Number of children: Not on file  . Years of education: Not on file  . Highest education level: Not on file  Social Needs  . Financial resource strain: Not on file  . Food insecurity - worry: Not on file  . Food insecurity - inability: Not on file  .  Transportation needs - medical: Not on file  . Transportation needs - non-medical: Not on file  Occupational History  . Not on file  Tobacco Use  . Smoking status: Never Smoker  . Smokeless tobacco: Never Used  Substance and Sexual Activity  . Alcohol use: Yes    Comment: socially  . Drug use: No  . Sexual activity: Not on file  Other Topics Concern  . Not on file  Social History Narrative  . Not on file          Mardella LaymanHagler, Demarian Epps, MD 11/23/17 [phone]

## 2017-11-23 LAB — URINE CYTOLOGY ANCILLARY ONLY
Chlamydia: POSITIVE — AB
NEISSERIA GONORRHEA: POSITIVE — AB
TRICH (WINDOWPATH): NEGATIVE

## 2020-09-10 ENCOUNTER — Other Ambulatory Visit: Payer: Self-pay

## 2020-09-10 ENCOUNTER — Encounter (HOSPITAL_COMMUNITY): Payer: Self-pay | Admitting: *Deleted

## 2020-09-10 ENCOUNTER — Ambulatory Visit (HOSPITAL_COMMUNITY)
Admission: EM | Admit: 2020-09-10 | Discharge: 2020-09-10 | Disposition: A | Discharge status: Home / Self Care | Payer: Self-pay | Attending: Family Medicine | Admitting: Family Medicine

## 2020-09-10 DIAGNOSIS — Z113 Encounter for screening for infections with a predominantly sexual mode of transmission: Secondary | ICD-10-CM

## 2020-09-10 NOTE — Discharge Instructions (Signed)
We will notify of you any positive findings or if any changes to treatment are needed. If normal or otherwise without concern to your results, we will not call you. Please log on to your MyChart to review your results if interested in so.   Withhold from intercourse until your test results are back.  Please use condoms to prevent stds

## 2020-09-10 NOTE — ED Triage Notes (Signed)
Pt reports it feels funny when he urinates

## 2020-09-10 NOTE — ED Provider Notes (Signed)
[facility]    CSN: [id number] Arrival date & time: 09/10/20  1404      History   Chief Complaint Chief Complaint  Patient presents with  . Dysuria    HPI Jacob Dunn is a 24 y.o. male.   Jacob Dunn presents with complaints of penile discharge he noticed x1 three days ago. No further discharge. No pain with urination. No other urinary symptoms. No pelvic pain. No penile sores lesions or genital redness swelling. Has had stds in the past, states this does not feel similar. Sexually active  With 1 male partner, doesn't use condoms.    ROS per HPI, negative if not otherwise mentioned.      History reviewed. No pertinent past medical history.  There are no problems to display for this patient.   History reviewed. No pertinent surgical history.     Home Medications    Prior to Admission medications   Medication Sig Start Date End Date Taking? Authorizing Provider  ibuprofen (ADVIL,MOTRIN) 600 MG tablet Take 1 tablet (600 mg total) by mouth every 6 (six) hours as needed for fever, headache, mild pain or moderate pain. 06/30/16   Antony Madura, PA-C    Family History History reviewed. No pertinent family history.  Social History Social History   Tobacco Use  . Smoking status: Never Smoker  . Smokeless tobacco: Never Used  Substance Use Topics  . Alcohol use: Yes    Comment: socially  . Drug use: No     Allergies   Patient has no known allergies.   Review of Systems Review of Systems   Physical Exam Triage Vital Signs ED Triage Vitals  Enc Vitals Group     BP 09/10/20 1516 125/71     Pulse Rate 09/10/20 1516 60     Resp 09/10/20 1516 15     Temp 09/10/20 1516 98.4 F (36.9 C)     Temp Source 09/10/20 1516 Oral     SpO2 09/10/20 1516 100 %     Weight 09/10/20 1518 170 lb (77.1 kg)     Height 09/10/20 1518 5\' 8"  (1.727 m)     Head Circumference --      Peak Flow --      Pain Score 09/10/20 1518 0     Pain Loc --       Pain Edu? --      Excl. in GC? --    No data found.  Updated Vital Signs BP 125/71   Pulse 60   Temp 98.4 F (36.9 C) (Oral)   Resp 15   Ht 5\' 8"  (1.727 m)   Wt 170 lb (77.1 kg)   SpO2 100%   BMI 25.85 kg/m   Visual Acuity Right Eye Distance:   Left Eye Distance:   Bilateral Distance:    Right Eye Near:   Left Eye Near:    Bilateral Near:     Physical Exam Constitutional:      Appearance: He is well-developed.  Cardiovascular:     Rate and Rhythm: Normal rate.  Pulmonary:     Effort: Pulmonary effort is normal.  Abdominal:     Palpations: Abdomen is not rigid.     Tenderness: Negative signs include Murphy's sign and McBurney's sign.     Comments: Denies scrotal redness, swelling, pain; denies sores or lesions; gu exam deferred   Skin:    General: Skin is warm and dry.  Neurological:     Mental Status: He is alert  and oriented to person, place, and time.      UC Treatments / Results  Labs (all labs ordered are listed, but only abnormal results are displayed) Labs Reviewed  CYTOLOGY, (ORAL, ANAL, URETHRAL) ANCILLARY ONLY    EKG   Radiology No results found.  Procedures Procedures (including critical care time)  Medications Ordered in UC Medications - No data to display  Initial Impression / Assessment and Plan / UC Course  I have reviewed the triage vital signs and the nursing notes.  Pertinent labs & imaging results that were available during my care of the patient were reviewed by me and considered in my medical decision making (see chart for details).     Penile cytology pending. Will treat according to results as no further symptoms. Safe sex encouraged. Return precautions provided. Patient verbalized understanding and agreeable to plan.   Final Clinical Impressions(s) / UC Diagnoses   Final diagnoses:  Screen for STD (sexually transmitted disease)     Discharge Instructions     We will notify of you any positive findings or if any  changes to treatment are needed. If normal or otherwise without concern to your results, we will not call you. Please log on to your MyChart to review your results if interested in so.   Withhold from intercourse until your test results are back.  Please use condoms to prevent stds   ED Prescriptions    None     PDMP not reviewed this encounter.   Georgetta Haber, NP 09/10/20 1533

## 2020-09-11 LAB — CYTOLOGY, (ORAL, ANAL, URETHRAL) ANCILLARY ONLY
Chlamydia: NEGATIVE
Comment: NEGATIVE
Comment: NEGATIVE
Comment: NORMAL
Neisseria Gonorrhea: NEGATIVE
Trichomonas: NEGATIVE
